# Patient Record
Sex: Female | Born: 2005 | Race: White | Hispanic: Yes | Marital: Single | Smoking: Never smoker
Health system: Southern US, Community
[De-identification: ages and names within clinical notes are randomized; demographics above are authoritative.]

## PROBLEM LIST (undated history)

## (undated) DIAGNOSIS — F419 Anxiety disorder, unspecified: Secondary | ICD-10-CM

## (undated) HISTORY — DX: Anxiety disorder, unspecified: F41.9

---
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
## 2023-01-16 ENCOUNTER — Other Ambulatory Visit: Payer: Self-pay | Admitting: Family Medicine

## 2023-02-12 ENCOUNTER — Encounter (HOSPITAL_BASED_OUTPATIENT_CLINIC_OR_DEPARTMENT_OTHER): Payer: Self-pay | Admitting: Emergency Medicine

## 2023-02-12 ENCOUNTER — Other Ambulatory Visit: Payer: Self-pay

## 2023-02-12 ENCOUNTER — Emergency Department (HOSPITAL_BASED_OUTPATIENT_CLINIC_OR_DEPARTMENT_OTHER)
Admission: EM | Admit: 2023-02-12 | Discharge: 2023-02-12 | Disposition: A | Discharge status: Home / Self Care | Payer: Medicaid Other | Attending: Emergency Medicine | Admitting: Emergency Medicine

## 2023-02-12 ENCOUNTER — Emergency Department (HOSPITAL_BASED_OUTPATIENT_CLINIC_OR_DEPARTMENT_OTHER): Payer: Medicaid Other

## 2023-02-12 DIAGNOSIS — Y92410 Unspecified street and highway as the place of occurrence of the external cause: Secondary | ICD-10-CM | POA: Insufficient documentation

## 2023-02-12 DIAGNOSIS — M545 Low back pain, unspecified: Secondary | ICD-10-CM | POA: Insufficient documentation

## 2023-02-12 LAB — PREGNANCY, URINE: Preg Test, Ur: NEGATIVE

## 2023-02-12 MED ORDER — LIDOCAINE 4 % EX PTCH
1.0000 | MEDICATED_PATCH | CUTANEOUS | 0 refills | Status: AC
Start: 2023-02-12 — End: ?

## 2023-02-12 MED ORDER — LIDOCAINE 5 % EX PTCH
1.0000 | MEDICATED_PATCH | CUTANEOUS | Status: DC
  Administered 2023-02-12: 1 via TRANSDERMAL
  Filled 2023-02-12 (×2): qty 1

## 2023-02-12 MED ORDER — CYCLOBENZAPRINE HCL 10 MG PO TABS
10.0000 mg | ORAL_TABLET | Freq: Two times a day (BID) | ORAL | 0 refills | Status: AC | PRN
Start: 2023-02-12 — End: ?

## 2023-02-12 NOTE — ED Notes (Signed)
Patient transported to X-ray 
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
## 2023-02-12 NOTE — ED Notes (Signed)
ED Provider at bedside. 

## 2023-02-12 NOTE — ED Notes (Signed)
Pt ambulatory to restroom for urine.

## 2023-02-12 NOTE — Discharge Instructions (Addendum)
Please take tylenol/ibuprofen and Flexeril for pain. I recommend close follow-up with PCP for reevaluation.  Please do not hesitate to return to emergency department if worrisome signs symptoms we discussed become apparent.
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
## 2023-08-21 ENCOUNTER — Telehealth: Payer: Self-pay | Admitting: General Practice

## 2023-08-21 DIAGNOSIS — Z789 Other specified health status: Secondary | ICD-10-CM

## 2023-08-21 MED ORDER — DROSPIRENONE-ETHINYL ESTRADIOL 3-0.02 MG PO TABS: 1.0000 | ORAL_TABLET | Freq: Every day | ORAL | 2 refills | Status: DC

## 2023-08-21 NOTE — Telephone Encounter (Signed)
Patient called and left message on nurse voicemail line stating she needs to change her pharmacy to one that is more convenient.   Called patient and she requests Rx go to [facility] on [address]. Discussed limited refills until she can come in for an annual since it's been a year and a half since we saw her last. Recommended [facility] office since it is closer to her but refills should last until she can be seen. Patient verbalized understanding.

## 2023-09-11 ENCOUNTER — Ambulatory Visit (INDEPENDENT_AMBULATORY_CARE_PROVIDER_SITE_OTHER): Payer: MEDICAID

## 2023-09-11 VITALS — BP 105/61 | HR 88 | Ht 62.0 in | Wt 106.0 lb

## 2023-09-11 DIAGNOSIS — Z789 Other specified health status: Secondary | ICD-10-CM

## 2023-09-11 DIAGNOSIS — Z01419 Encounter for gynecological examination (general) (routine) without abnormal findings: Secondary | ICD-10-CM

## 2023-09-11 DIAGNOSIS — Z3041 Encounter for surveillance of contraceptive pills: Secondary | ICD-10-CM | POA: Diagnosis not present

## 2023-09-11 DIAGNOSIS — N390 Urinary tract infection, site not specified: Secondary | ICD-10-CM | POA: Diagnosis not present

## 2023-09-11 DIAGNOSIS — Z0000 Encounter for general adult medical examination without abnormal findings: Secondary | ICD-10-CM

## 2023-09-11 NOTE — Progress Notes (Unsigned)
    GYNECOLOGY OFFICE VISIT NOTE-WELL WOMAN EXAM  History:   Maria Tran is a 17 year old here today for annual exam. She reports her LMP was last week.  She reports "medium" flow with some cramping that is relieved with ibuprofen. She denies any abnormal vaginal discharge, bleeding, pelvic pain or pain/discomfort with sex.   Birth Control:  Oral Contraception. Reports brand was switched and unsure why.  States has been having HA and acne with switch.   Reproductive Concerns Sexually Active: Yes Partners Type: Female Number of partners in last year: One STD Testing: Declines  Obstetrical History: [id number]  Gynecological History: No history Vaginal/GU Concerns: No issues with constipation or diarrhea outside of menstrual cycle.  She reports having 3-4 UTIs in the past year. She does note one occurred after sex.  She has changed soap and added water filter to her shower. No issues "in a couple of months."  She reports dysuria, minimal output, and blood in urine.  Breast Concerns/Exams: She denies issues with breast.  She reports maternal family history of breast CA and paternal great grandmother with ovarian CA. No history of uterine, cervical, or ovarian cancer  Medical and Nutrition PCP: [facility]  Significant PMx: No issues Exercise: 3x/day, Cardio (3 miles), Strength training, Swimming.  Tobacco/Drugs/Alcohol/Vaping: Denies Nutrition: "Depends" when asked about balanced nutritional intake  Social Safety at home: mom, dad, and 2 sisters. Endorses safety.  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 Wants to help direct films or start own camera company!  Past Medical History:  Diagnosis Date   Anxiety     Past Surgical History:  Procedure Laterality Date   CYSTECTOMY Right 2015   right side of face    The following portions of the patient's history were  reviewed and updated as appropriate: allergies, current medications, past family history, past medical history, past social history, past surgical history and problem list.   Health Maintenance: Pap: None,   Mammogram: N/A.  Colonoscopy: N/A Review of Systems:  Pertinent items noted in HPI and remainder of comprehensive ROS otherwise negative.    Objective:    Physical Exam BP 105/61   Pulse 88   Ht 5\' 2"  (1.575 m)   Wt 106 lb (48.1 kg)   LMP 09/07/2023 (Exact Date)   BMI 19.39 kg/m  Physical Exam   Labs and Imaging No results found for this or any previous visit (from the past 168 hour(s)). No results found.   Assessment & Plan:       1. Well woman exam without gynecological exam ***   Routine preventative health maintenance measures emphasized. Please refer to After Visit Summary for other counseling recommendations.   No follow-ups on file.      Cherre Robins, CNM 09/11/2023

## 2023-09-12 MED ORDER — DROSPIRENONE-ETHINYL ESTRADIOL 3-0.02 MG PO TABS: 1.0000 | ORAL_TABLET | Freq: Every day | ORAL | 3 refills | Status: AC

## 2023-10-04 ENCOUNTER — Ambulatory Visit (INDEPENDENT_AMBULATORY_CARE_PROVIDER_SITE_OTHER): Payer: MEDICAID | Admitting: Urology

## 2023-10-04 ENCOUNTER — Encounter: Payer: Self-pay | Admitting: Urology

## 2023-10-04 VITALS — BP 108/73 | HR 76 | Ht 62.0 in | Wt 106.0 lb

## 2023-10-04 DIAGNOSIS — Z87440 Personal history of urinary (tract) infections: Secondary | ICD-10-CM | POA: Diagnosis not present

## 2023-10-04 DIAGNOSIS — Z09 Encounter for follow-up examination after completed treatment for conditions other than malignant neoplasm: Secondary | ICD-10-CM

## 2023-10-04 DIAGNOSIS — N390 Urinary tract infection, site not specified: Secondary | ICD-10-CM

## 2023-10-04 LAB — MICROSCOPIC EXAMINATION

## 2023-10-04 LAB — URINALYSIS, ROUTINE W REFLEX MICROSCOPIC
Bilirubin, UA: NEGATIVE
Glucose, UA: NEGATIVE
Ketones, UA: NEGATIVE
Leukocytes,UA: NEGATIVE
Nitrite, UA: NEGATIVE
Protein,UA: NEGATIVE
Specific Gravity, UA: >1.03 — ABNORMAL HIGH (ref 1.005–1.030)
Urobilinogen, Ur: 0.2 mg/dL (ref 0.2–1.0)
pH, UA: 5.5 (ref 5.0–7.5)

## 2023-10-04 MED ORDER — SULFAMETHOXAZOLE-TRIMETHOPRIM 400-80 MG PO TABS: 1.0000 | ORAL_TABLET | Freq: Every evening | ORAL | 1 refills | Status: DC

## 2023-10-04 NOTE — Progress Notes (Signed)
   Assessment: 1. Recurrent UTI      Plan: UTI prevention strategies reviewed in detail today with patient including-- 1.  Proper fluid intake/hydration 2.  Voiding habits reviewed including not to hold urine prolongedly 3.  Local hygiene including wipe, wash, and rinse from front to back 4.  Cranberry supplementation 5.  Urinary probiotic with lactobacillus 6.  Patient will also take a short course of low-dose antibiotic prophylaxis for 6 weeks and will then transition to postcoital prophylaxis.  Follow-up 3 to 4 months for recheck  Chief Complaint: recurrent UTI  History of Present Illness:  Maria Tran is a 17 y.o. female who is seen in consultation from [facility] for evaluation of recurrent urinary tract infection.  UTIs have been symptomatic with frequency urgency and dysuria and 1 episode when she had some pink urine.  No history of fever. Laboratory review reveals normal renal function.  Recent culture from 09/14/2022 demonstrated pansensitive E. coli.This was her fourth symptomatic UTI since February 2024.  Patient is sexually active but does not note any temporal relationship between her UTIs and sexual intercourse except for on 1 occasion last winter. Patient had CT stone study 08/2021 for abdominal pain that showed no significant GU findings. No current GU complaints.  Past Medical History:  Past Medical History:  Diagnosis Date   Anxiety     Past Surgical History:  Past Surgical History:  Procedure Laterality Date   CYSTECTOMY Right 2015   right side of face    Allergies:  No Known Allergies  Family History:  Family History  Problem Relation Age of Onset   Diabetes Father    Heart disease Father     Social History:  Social History   Tobacco Use   Smoking status: Never    Passive exposure: Never   Smokeless tobacco: Never  Vaping Use   Vaping status: Never Used  Substance Use Topics   Alcohol use: Never   Drug use: Never    Review of  symptoms:  Constitutional:  Negative for unexplained weight loss, night sweats, fever, chills ENT:  Negative for nose bleeds, sinus pain, painful swallowing CV:  Negative for chest pain, shortness of breath, exercise intolerance, palpitations, loss of consciousness Resp:  Negative for cough, wheezing, shortness of breath GI:  Negative for nausea, vomiting, diarrhea, bloody stools GU:  Positives noted in HPI; otherwise negative for gross hematuria, dysuria, urinary incontinence Neuro:  Negative for seizures, poor balance, limb weakness, slurred speech Psych:  Negative for lack of energy, depression, anxiety Endocrine:  Negative for polydipsia, polyuria, symptoms of hypoglycemia (dizziness, hunger, sweating) Hematologic:  Negative for anemia, purpura, petechia, prolonged or excessive bleeding, use of anticoagulants  Allergic:  Negative for difficulty breathing or choking as a result of exposure to anything; no shellfish allergy; no allergic response (rash/itch) to materials, foods  Physical exam: BP 108/73   Pulse 76   Ht 5\' 2"  (1.575 m)   Wt 106 lb (48.1 kg)   LMP 09/07/2023 (Exact Date)   BMI 19.39 kg/m  GENERAL APPEARANCE:  Well appearing, well developed, well nourished, NAD   Results: UA today is remarkably dip negative but does show bacteria on microscopic exam-sent for culture (patient asymptomatic)

## 2023-10-05 LAB — URINE CULTURE: Organism ID, Bacteria: NO GROWTH

## 2023-12-13 ENCOUNTER — Ambulatory Visit (HOSPITAL_COMMUNITY)
Admission: RE | Admit: 2023-12-13 | Discharge: 2023-12-13 | Disposition: A | Discharge status: Home / Self Care | Payer: MEDICAID | Source: Ambulatory Visit | Attending: Pediatrics | Admitting: Pediatrics

## 2023-12-13 ENCOUNTER — Other Ambulatory Visit (HOSPITAL_COMMUNITY): Payer: Self-pay | Admitting: Pediatrics

## 2023-12-13 DIAGNOSIS — R221 Localized swelling, mass and lump, neck: Secondary | ICD-10-CM

## 2024-01-09 ENCOUNTER — Ambulatory Visit (INDEPENDENT_AMBULATORY_CARE_PROVIDER_SITE_OTHER): Payer: MEDICAID | Admitting: Urology

## 2024-01-09 ENCOUNTER — Encounter: Payer: Self-pay | Admitting: Urology

## 2024-01-09 VITALS — BP 102/68 | HR 83

## 2024-01-09 DIAGNOSIS — Z09 Encounter for follow-up examination after completed treatment for conditions other than malignant neoplasm: Secondary | ICD-10-CM | POA: Diagnosis not present

## 2024-01-09 DIAGNOSIS — Z87440 Personal history of urinary (tract) infections: Secondary | ICD-10-CM

## 2024-01-09 DIAGNOSIS — N390 Urinary tract infection, site not specified: Secondary | ICD-10-CM

## 2024-01-09 LAB — URINALYSIS, ROUTINE W REFLEX MICROSCOPIC
Bilirubin, UA: NEGATIVE
Glucose, UA: NEGATIVE
Ketones, UA: NEGATIVE
Leukocytes,UA: NEGATIVE
Nitrite, UA: NEGATIVE
RBC, UA: NEGATIVE
Specific Gravity, UA: >1.03 — ABNORMAL HIGH (ref 1.005–1.030)
Urobilinogen, Ur: 0.2 mg/dL (ref 0.2–1.0)
pH, UA: 6 (ref 5.0–7.5)

## 2024-01-09 LAB — MICROSCOPIC EXAMINATION

## 2024-01-09 MED ORDER — NITROFURANTOIN MONOHYD MACRO 100 MG PO CAPS: ORAL_CAPSULE | ORAL | 5 refills | Status: AC

## 2024-01-09 NOTE — Addendum Note (Signed)
 Addended by: Joline Maxcy on: 01/09/2024 09:42 AM   Modules accepted: Orders

## 2024-01-09 NOTE — Progress Notes (Signed)
   Assessment: 1. Recurrent UTI     Plan: Continue UTI prevention measures. Will change from Bactrim to Macrobid postcoital prophylaxis FU 1 yr or sooner if problems arise  Chief Complaint: Recurrent UTI  HPI: Maria Tran is a 18 y.o. female who presents for continued evaluation of recurrent UTI. Please see my note 10/04/2023 at the time of initial visit for detailed history and exam. She reports doing very well-no breakthrough infections since her initial visit after employing UTI prevention measures.  She has transition to postcoital prophylaxis with Bactrim but thinks that it gives her a headache.  We will change to Macrobid.  She also reports that she is doing well with drinking a lot of water and also taking a cranberry and urinary probiotic supplement.  Portions of the above documentation were copied from a prior visit for review purposes only.  Allergies: No Known Allergies  PMH: Past Medical History:  Diagnosis Date   Anxiety     PSH: Past Surgical History:  Procedure Laterality Date   CYSTECTOMY Right 2015   right side of face    SH: Social History   Tobacco Use   Smoking status: Never    Passive exposure: Never   Smokeless tobacco: Never  Vaping Use   Vaping status: Never Used  Substance Use Topics   Alcohol use: Never   Drug use: Never    ROS: Constitutional:  Negative for fever, chills, weight loss CV: Negative for chest pain, previous MI, hypertension Respiratory:  Negative for shortness of breath, wheezing, sleep apnea, frequent cough GI:  Negative for nausea, vomiting, bloody stool, GERD  PE: Vitals:   01/09/24 0920  BP: 102/68  Pulse: 83    GENERAL APPEARANCE:  Well appearing, well developed, well nourished, NAD    Results: Ua neg

## 2024-11-03 ENCOUNTER — Other Ambulatory Visit: Payer: Self-pay

## 2024-11-03 DIAGNOSIS — Z3041 Encounter for surveillance of contraceptive pills: Secondary | ICD-10-CM

## 2024-11-03 DIAGNOSIS — Z789 Other specified health status: Secondary | ICD-10-CM

## 2024-11-03 MED ORDER — DROSPIRENONE-ETHINYL ESTRADIOL 3-0.03 MG PO TABS: 1.0000 | ORAL_TABLET | Freq: Every day | ORAL | 1 refills | Status: AC

## 2024-12-02 ENCOUNTER — Ambulatory Visit: Payer: MEDICAID

## 2025-01-02 ENCOUNTER — Ambulatory Visit: Payer: MEDICAID

## 2025-01-08 ENCOUNTER — Ambulatory Visit: Payer: MEDICAID | Admitting: Urology
